# Patient Record
Sex: Male | Born: 1985 | Race: White | Hispanic: No | Marital: Single | Smoking: Never smoker
Health system: Southern US, Community
[De-identification: ages and names within clinical notes are randomized; demographics above are authoritative.]

## PROBLEM LIST (undated history)

## (undated) DIAGNOSIS — G43909 Migraine, unspecified, not intractable, without status migrainosus: Secondary | ICD-10-CM

## (undated) HISTORY — DX: Migraine, unspecified, not intractable, without status migrainosus: G43.909

---

## 2010-04-10 IMAGING — CR DG ANKLE COMPLETE 3+V*R*
3 series · 3 of 3 positions shown · non-contrast
Comparison: None.

CLINICAL DATA: Twisted ankle.

RIGHT ANKLE - COMPLETE 3+ VIEW

[view not recorded (1 of 3)]
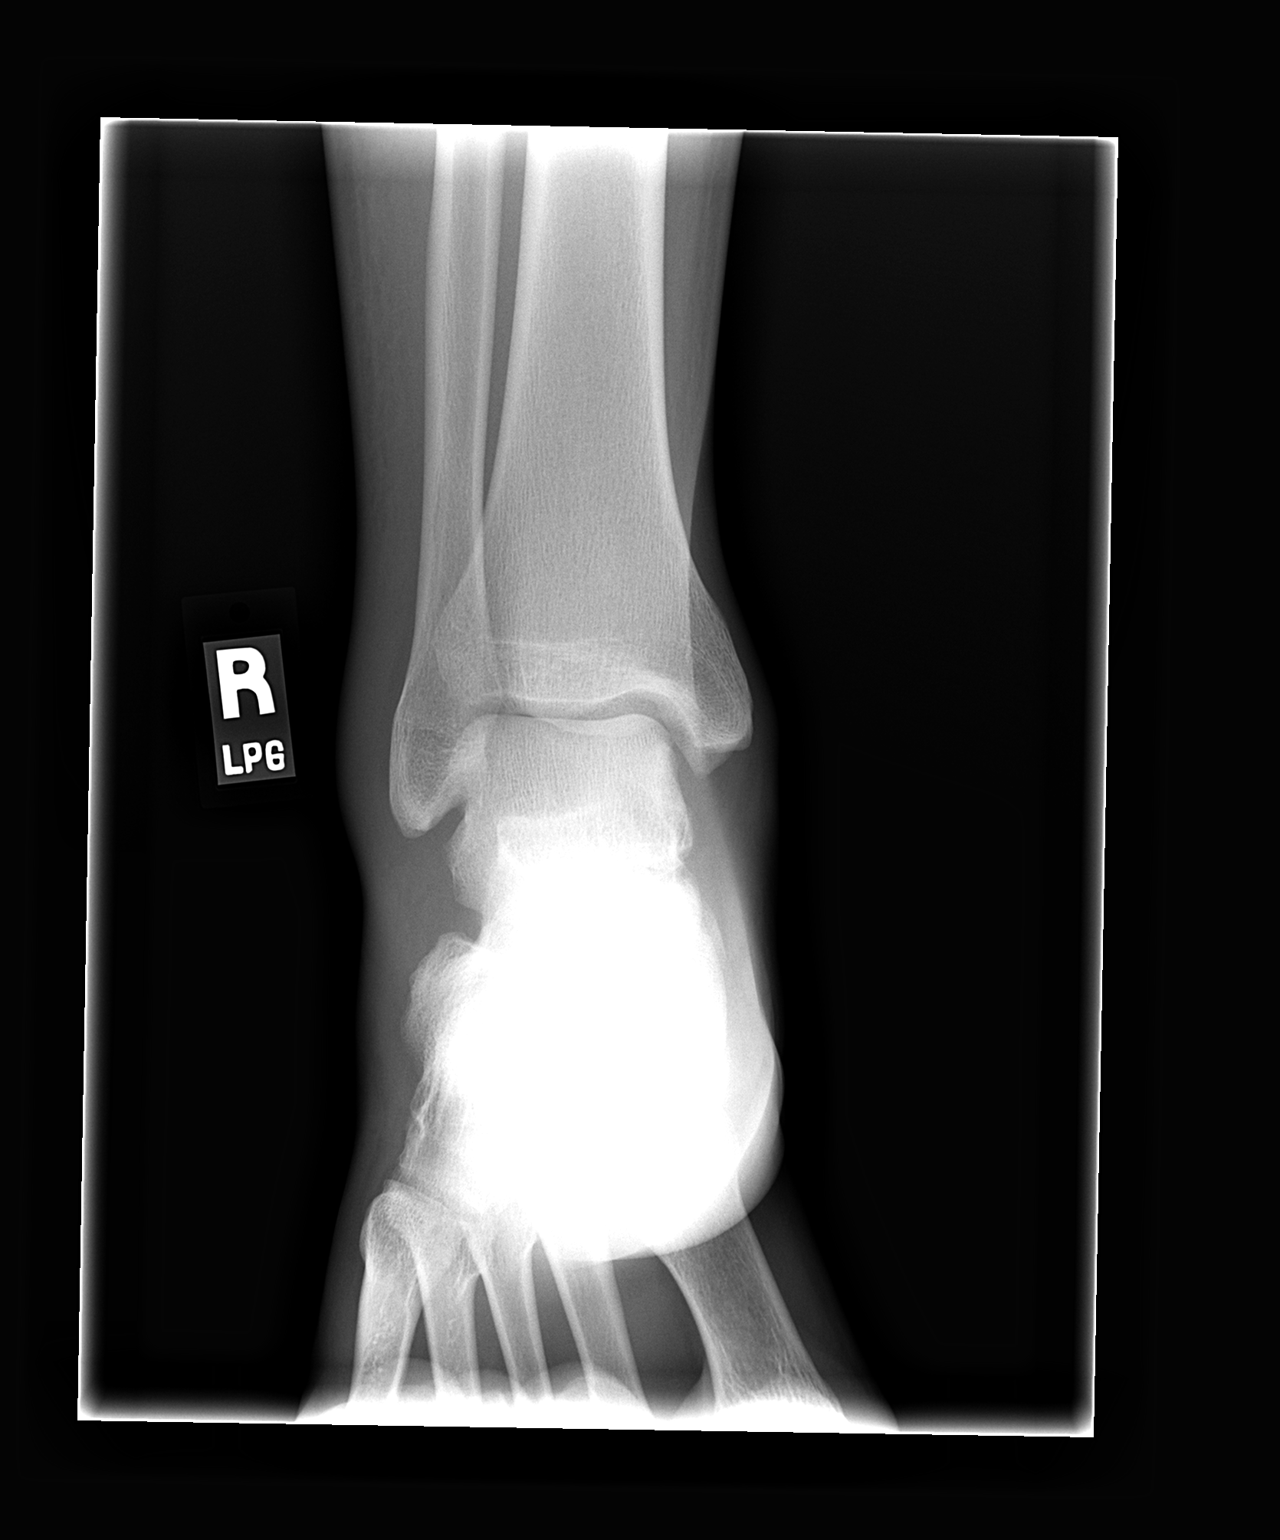

[view not recorded (2 of 3)]
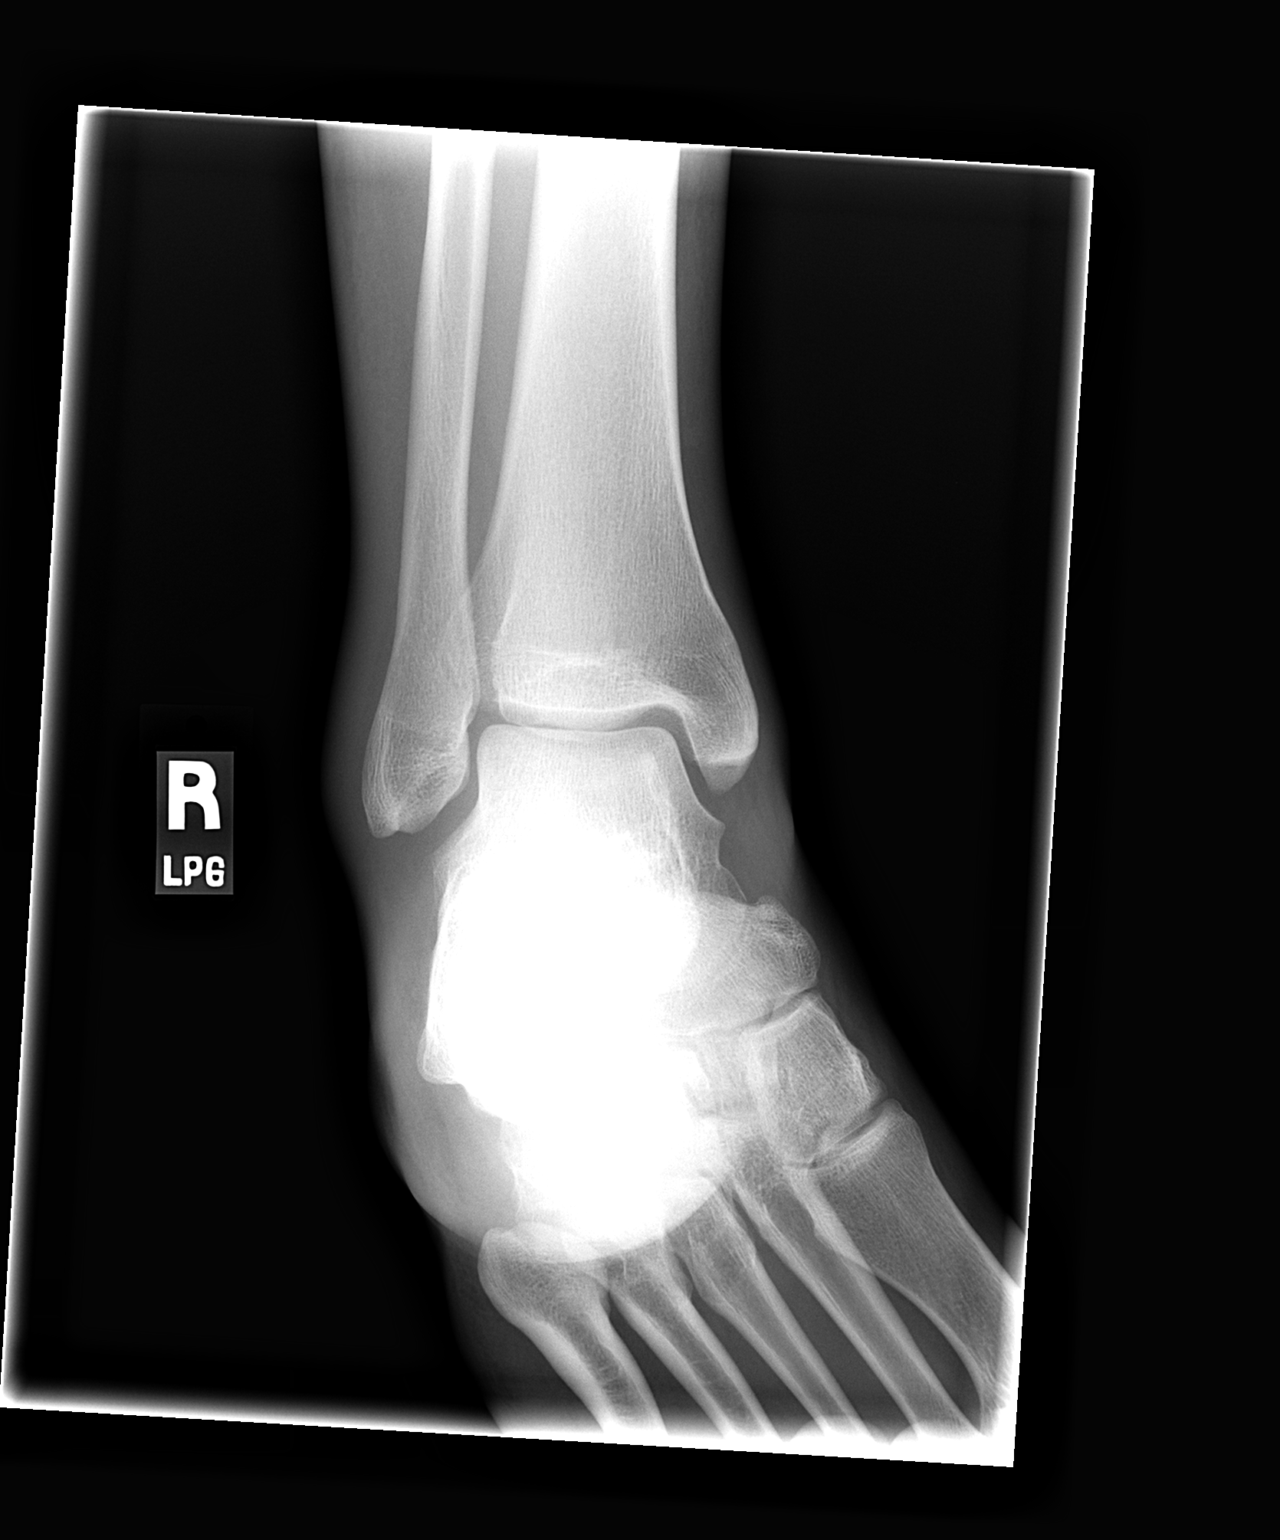

[view not recorded (3 of 3)]
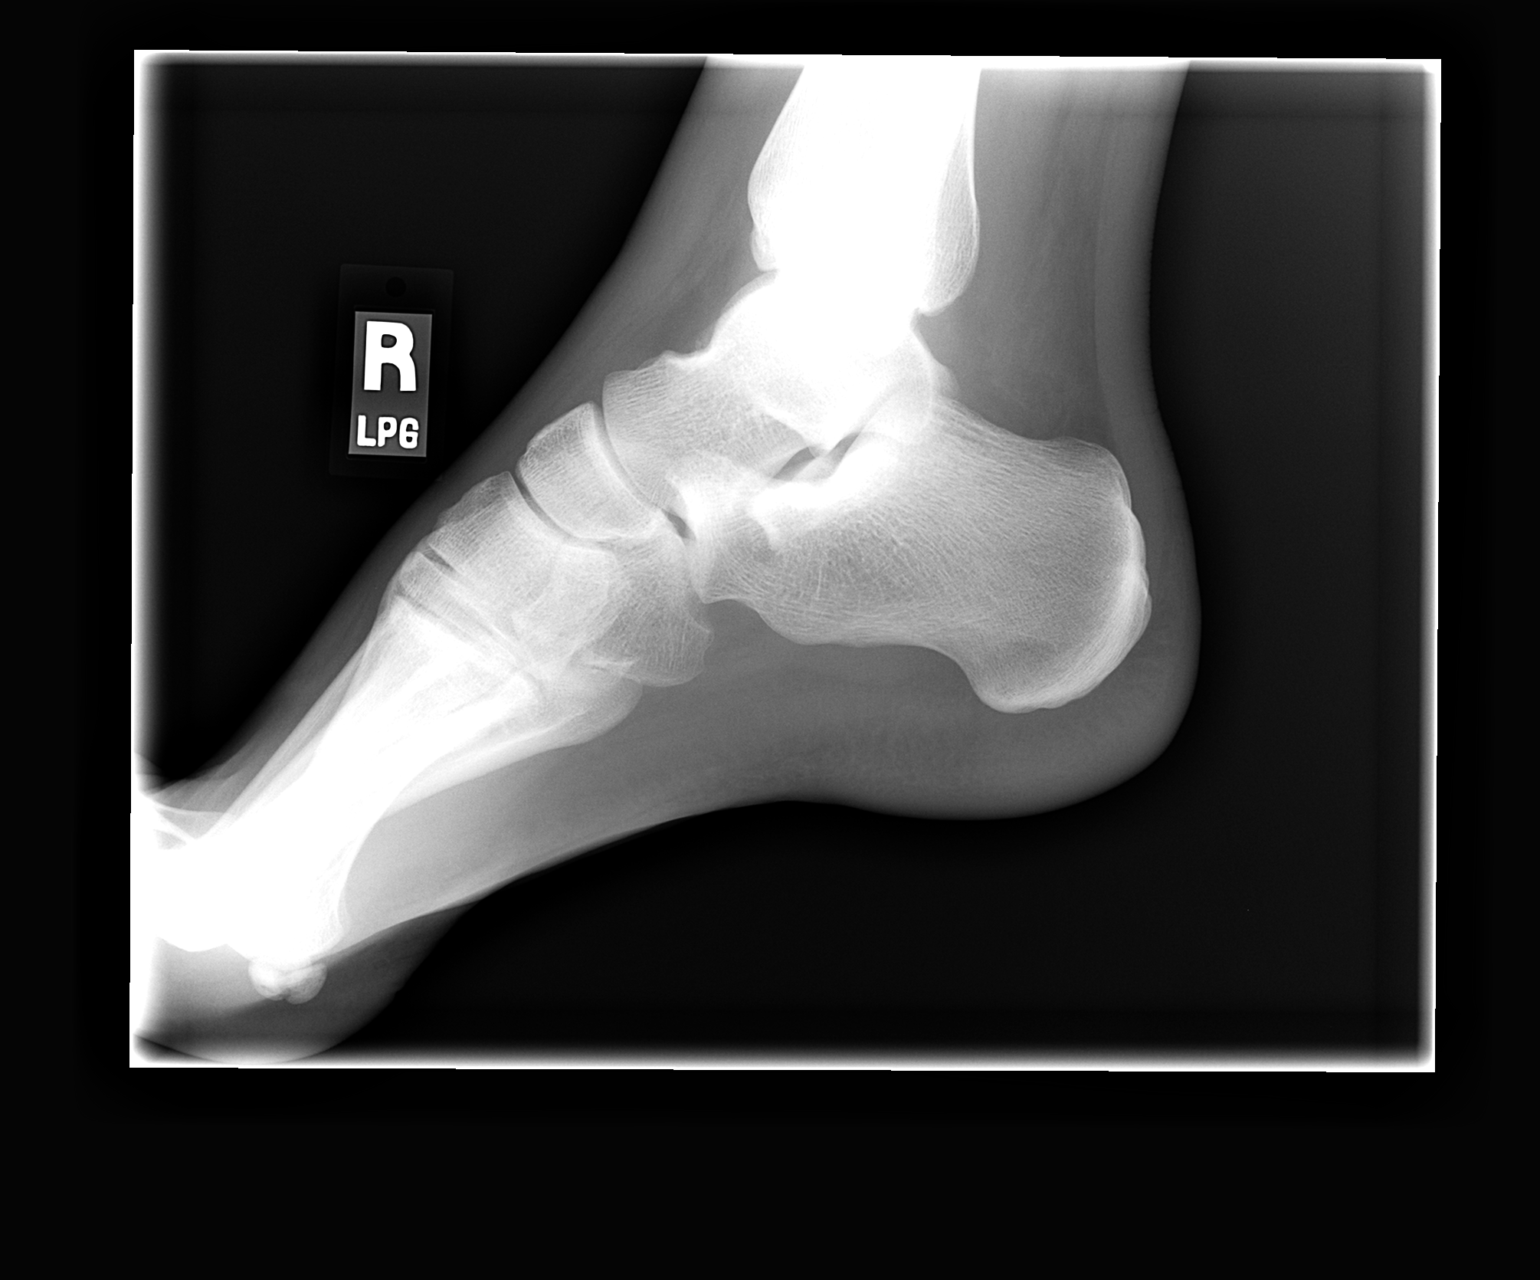

[3 of 3 positions shown; findings below may reference images not displayed]

FINDINGS: Negative for fracture.  There is lateral soft tissue
swelling.  There is a small joint effusion.
IMPRESSION: Negative for fracture.

## 2011-02-18 ENCOUNTER — Emergency Department
Admission: EM | Admit: 2011-02-18 | Disposition: A | Discharge status: Home / Self Care | Payer: Self-pay | Source: Ambulatory Visit

## 2017-12-16 ENCOUNTER — Emergency Department
Admission: EM | Admit: 2017-12-16 | Discharge: 2017-12-16 | Disposition: A | Discharge status: Home / Self Care | Payer: Self-pay | Attending: Nurse Practitioner | Admitting: Nurse Practitioner

## 2017-12-16 DIAGNOSIS — T671XXA Heat syncope, initial encounter: Secondary | ICD-10-CM | POA: Insufficient documentation

## 2017-12-16 DIAGNOSIS — E860 Dehydration: Secondary | ICD-10-CM | POA: Insufficient documentation

## 2017-12-16 LAB — CBC AND DIFFERENTIAL
Basophils %: 1 % (ref 0.0–3.0)
Basophils Absolute: 0.1 10*3/uL (ref 0.0–0.3)
Eosinophils %: 1.7 % (ref 0.0–7.0)
Eosinophils Absolute: 0.1 10*3/uL (ref 0.0–0.8)
Hematocrit: 40.2 % (ref 39.0–52.5)
Hemoglobin: 14 gm/dL (ref 13.0–17.5)
Lymphocytes Absolute: 1.3 10*3/uL (ref 0.6–5.1)
Lymphocytes: 24.6 % (ref 15.0–46.0)
MCH: 32 pg (ref 28–35)
MCHC: 35 gm/dL (ref 31–36)
MCV: 91 fL (ref 80–100)
MPV: 7.2 fL (ref 6.0–10.0)
Monocytes Absolute: 0.3 10*3/uL (ref 0.1–1.7)
Monocytes: 4.9 % (ref 3.0–15.0)
Neutrophils %: 67.8 % (ref 42.0–78.0)
Neutrophils Absolute: 3.6 10*3/uL (ref 1.7–8.6)
PLT CT: 210 10*3/uL (ref 130–440)
RBC: 4.4 10*6/uL (ref 4.00–5.70)
RDW: 10.6 % (ref 10.5–14.5)
WBC: 5.3 10*3/uL (ref 4.0–11.0)

## 2017-12-16 LAB — COMPREHENSIVE METABOLIC PANEL
ALT: 15 U/L (ref 0–55)
AST (SGOT): 23 U/L (ref 10–42)
Albumin/Globulin Ratio: 1.93 Ratio (ref 0.80–2.00)
Albumin: 4.4 gm/dL (ref 3.5–5.0)
Alkaline Phosphatase: 71 U/L (ref 40–145)
Anion Gap: 14 mMol/L (ref 7.0–18.0)
BUN / Creatinine Ratio: 23.8 Ratio (ref 10.0–30.0)
BUN: 20 mg/dL (ref 7–22)
Bilirubin, Total: 0.6 mg/dL (ref 0.1–1.2)
CO2: 25.7 mMol/L (ref 20.0–30.0)
Calcium: 8.7 mg/dL (ref 8.5–10.5)
Chloride: 105 mMol/L (ref 98–110)
Creatinine: 0.84 mg/dL (ref 0.80–1.30)
EGFR: 118 mL/min/{1.73_m2} (ref 60–150)
Globulin: 2.3 gm/dL (ref 2.0–4.0)
Glucose: 113 mg/dL — ABNORMAL HIGH (ref 71–99)
Osmolality Calc: 283 mOsm/kg (ref 275–300)
Potassium: 4.1 mMol/L (ref 3.5–5.3)
Protein, Total: 6.6 gm/dL (ref 6.0–8.3)
Sodium: 140 mMol/L (ref 136–147)

## 2017-12-16 LAB — VH URINALYSIS WITH MICROSCOPIC AND CULTURE IF INDICATED
Bilirubin, UA: NEGATIVE mg/dL
Blood, UA: NEGATIVE mg/dL
Glucose, UA: NEGATIVE mg/dL
Leukocyte Esterase, UA: NEGATIVE Leu/uL
Nitrite, UA: NEGATIVE
Protein, UR: NEGATIVE mg/dL
RBC, UA: NONE SEEN /hpf
Urine Specific Gravity: 1.02 (ref 1.001–1.040)
Urobilinogen, UA: 0.2 mg/dL
WBC, UA: NONE SEEN /hpf
pH, Urine: 6.5 pH (ref 5.0–8.0)

## 2017-12-16 MED ORDER — SODIUM CHLORIDE 0.9 % IV BOLUS
1000.0000 mL | Freq: Once | INTRAVENOUS | Status: AC
Start: 2017-12-16 — End: 2017-12-16
  Administered 2017-12-16: 1000 mL via INTRAVENOUS

## 2017-12-16 NOTE — Discharge Instructions (Signed)
Sure to increase her fluid intake in times of high heat and outdoor work to minimize dehydration risks.    Understanding Heat Stress  In hot environments, your body may have trouble keeping its temperature at a safe level. As a result, your body temperature rises to unsafe levels. This is heat stress. Heat stress can develop quickly. It can also be very dangerous to your health.  How your body handles heat  To function, your body requires your core temperaturestay very close to normal. Your body has a normal core temperature of 98.44F (37C). If the environment is hot or you are very active, your body heats up. To keep your core temperature stable, your body releases excess heat into the air. The heat leaves your body from the blood vessels near the skin's surface and through sweat.  Blood flow cools your body  When your body needs to release heat, the blood vessels near the surface of your skin widen. Extra blood flows through them. This extra blood brings more body heat to the surface, which releases it into the air. Your body needs enough water and minerals, such as sodium, to keep blood flowing smoothly to these vessels and to the rest of the body.  Sweat carries away heat  If increased blood flow alone isn't enough, your body also increases the amount that you sweat. As sweat dries (evaporates), it cools the skin. As you sweat, your body loses water (and some minerals such as sodium and potassium). This water (and minerals) must be replaced to keep you feeling well and healthy, and to allow further sweating.  Conditions that contribute to heat stress   Too much activity. The more active you are, the more heat your muscles generate. Heavy physical activity also sets up competition between your muscles and skin for the blood supply.   Poor acclimatization. If you are not used to physical activity or not used to hot temperatures, you are more likely to have effects from the heat.   High environmental  temperature. As the temperature in your environment goes up, so does your body temperature. When it's hot from the sun or another heat source, such as a furnace, your body can't move heat into the air as effectively.   Too little air movement. Air moving across your skin carries away heat brought to the surface by blood vessels. It also helps sweat evaporate. Too little air movement means these processes don't work as well.   High humidity. Humidity is the amount of moisture in the air. The higher the humidity, the less sweat evaporates. That's because the air is too wet to absorb more moisture.   Medical problems. Some medical conditions, including diabetes and heart failure, can increase your susceptibility to heat stress.   Medicine. If you take medicine for heart rate, asthma, kidney disease, or to manage fluid retention, you may be more sensitive to heat.  Date Last Reviewed: 11/24/2016   2000-2018 [employer] [place]. [address] [place], [place] [address]. All rights reserved. This information is not intended as a substitute for professional medical care. Always follow your healthcare professional's instructions.

## 2017-12-16 NOTE — ED Provider Notes (Signed)
[facility] EMERGENCY DEPARTMENT   History and Physical Exam      Patient Name: Jared Marsh, Jared Marsh  Encounter Date:  12/16/2017  Attending Physician: Gareth Morgan,*  Nurse Practitioner: Jefm Petty, NP  PCP: Marisa Sprinkles, MD  Patient DOB:  01/28/1986  MRN:  [id number]  Room:  E6/ED6-A      History of Presenting Illness     Chief complaint: Syncope    HPI/ROS is limited by: none  HPI/ROS given by: patient      Provocative: dehydration  Pallative Factors: Fluid intake  Quality: Syncope  Region: cerebral  Radiation: none  Severity: moderate  Duration/Temporal Factors: 2 hours PTA      Jared Marsh is a 32 y.o. male who presents with Complaints of syncopal episode after having worked outside all day today in [place]. Patient states he's been working outside since approximately 0700 this morning until 5 PM and son. He is noticed to have a bright red sunburn area directly below his hairline on his forehead.   He states that he had take minimal by mouth intake throughout the daytime today " probably less than 1/2 gallon".  States once he was inside stood up and began to feel lightheaded and is reported to have fallen striking the back of his head on the floor.  He believes he was only out for a few seconds and when he sat up and became lightheaded and passed out again. No contusions or abrasions are noted on the scalp.   By-standers state that he was profusely diaphoretic surrounding the event.  He admits to a frontal headache which she says is typical of his usual migraine presentation.  He also states he hasn't had "spots" in his right visual field but there are no longer present.  This is also typical of his migraine presentation.  Current headache is stated to be 3 out of 10.  He denies previous fever, chills, no current nausea or vomiting.  He has not attempted any treatment prior to arrival.       Review of Systems   Review of Systems   Constitutional: Negative for chills, fever and weight loss.   HENT: Negative for  congestion, ear pain, sinus pain and sore throat.    Eyes:        Previous "Spots" in the right vision   Respiratory: Negative for cough, sputum production, shortness of breath and wheezing.    Cardiovascular: Negative for chest pain and leg swelling.   Gastrointestinal: Negative for abdominal pain, diarrhea, nausea and vomiting.   Genitourinary: Negative for dysuria.   Musculoskeletal: Positive for falls.   Skin:        Forehead sunburn   Neurological: Positive for dizziness, loss of consciousness and headaches.   Endo/Heme/Allergies: Negative.    Psychiatric/Behavioral: Negative.        Allergies     Pt has No Known Allergies.    Medications     No current outpatient prescriptions on file.       Past Medical History     Pt has a past medical history of Migraines.    Past Surgical History     Pt has no past surgical history on file.    Family History     The family history is not on file.    Social History     Pt reports that he has never smoked. His smokeless tobacco use includes Chew. He reports that he does not drink alcohol or use drugs.  Physical Exam     Vitals:    12/16/17 1919 12/16/17 1951 12/16/17 2020   BP: 145/88 127/83 140/80   Pulse: 75 67 68   Resp: 18 16 13    Temp: 98.2 F (36.8 C)     TempSrc: Oral     SpO2: 99% 98% 97%       Physical Exam   Constitutional: He is oriented to person, place, and time. He appears well-developed and well-nourished.   HENT:   Head: Normocephalic and atraumatic.   Right Ear: Hearing and tympanic membrane normal.   Left Ear: Hearing and tympanic membrane normal.   Nose: Nose normal.   Mouth/Throat: Uvula is midline. Mucous membranes are dry.   Eyes: Pupils are equal, round, and reactive to light.   Neck: Normal range of motion. Neck supple. No thyromegaly present.   Cardiovascular: Normal rate, normal heart sounds and intact distal pulses.    Pulmonary/Chest: Effort normal and breath sounds normal. He has no wheezes.   Abdominal: Soft. Bowel sounds are normal. He  exhibits no distension.   Musculoskeletal: Normal range of motion.   Neurological: He is alert and oriented to person, place, and time. He has normal strength and normal reflexes. A sensory deficit is present. No cranial nerve deficit. He displays a negative Romberg sign. GCS eye subscore is 4. GCS verbal subscore is 5. GCS motor subscore is 6.   Skin: Skin is warm and dry.   Psychiatric: He has a normal mood and affect.   Nursing note and vitals reviewed.      Orders Placed     Orders Placed This Encounter   Procedures   . CBC   . CMP   . Urinalysis w Microscopic and Culture if Indicated   . ECG 12 lead (Stat)       Diagnostic Results       The results of the diagnostic studies below have been reviewed by myself:    Labs  Results     Procedure Component Value Units Date/Time    Urinalysis w Microscopic and Culture if Indicated [[id number]]  (Abnormal) Collected:  12/16/17 2018    Specimen:  Urine, Random Updated:  12/16/17 2120     Color, UA Yellow     Clarity, UA Clear     Specific Gravity, UR 1.020     pH, Urine 6.5 pH      Protein, UR Negative mg/dL      Glucose, UA Negative mg/dL      Ketones UA Trace (A) mg/dL      Bilirubin, UA Negative mg/dL      Blood, UA Negative mg/dL      Nitrite, UA Negative     Urobilinogen, UA 0.2 mg/dL      Leukocyte Esterase, UA Negative Leu/uL      UR Micro Performed     WBC, UA None Seen /hpf      RBC, UA None Seen /hpf      Bacteria, UA None /hpf      Squam Epithel, UA 1-5 /lpf     CMP [[id number]]  (Abnormal) Collected:  12/16/17 1941    Specimen:  Plasma Updated:  12/16/17 2039     Sodium 140 mMol/L      Potassium 4.1 mMol/L      Chloride 105 mMol/L      CO2 25.7 mMol/L      Calcium 8.7 mg/dL      Glucose 782 (H) mg/dL  Creatinine 0.84 mg/dL      BUN 20 mg/dL      Protein, Total 6.6 gm/dL      Albumin 4.4 gm/dL      Alkaline Phosphatase 71 U/L      ALT 15 U/L      AST (SGOT) 23 U/L      Bilirubin, Total 0.6 mg/dL      Albumin/Globulin Ratio 1.93 Ratio      Anion Gap 14.0  mMol/L      BUN/Creatinine Ratio 23.8 Ratio      EGFR 118 mL/min/1.55m2      Osmolality Calc 283 mOsm/kg      Globulin 2.3 gm/dL     CBC [[id number]] Collected:  12/16/17 1941    Specimen:  Blood from Blood Updated:  12/16/17 2024     WBC 5.3 K/cmm      RBC 4.40 M/cmm      Hemoglobin 14.0 gm/dL      Hematocrit 56.2 %      MCV 91 fL      MCH 32 pg      MCHC 35 gm/dL      RDW 13.0 %      PLT CT 210 K/cmm      MPV 7.2 fL      NEUTROPHIL % 67.8 %      Lymphocytes 24.6 %      Monocytes 4.9 %      Eosinophils % 1.7 %      Basophils % 1.0 %      Neutrophils Absolute 3.6 K/cmm      Lymphocytes Absolute 1.3 K/cmm      Monocytes Absolute 0.3 K/cmm      Eosinophils Absolute 0.1 K/cmm      BASO Absolute 0.1 K/cmm           Radiologic Studies  Radiology Results (24 Hour)     ** No results found for the last 24 hours. **          EKG:   Last EKG Result     None            Procedures       ED Course & MDM / Critical Care     Blood pressure 140/80, pulse 68, temperature 98.2 F (36.8 C), temperature source Oral, resp. rate 13, SpO2 97 %.    I reviewed the vital signs, nursing notes, past medical history, past surgical history, family history and social history.   I have reviewed the patient's previous charts.         ED Course as of Dec 16 2212   Thu Dec 16, 2017   2015 Discussed current workup with patient and need to evaluate CT of the head.  He states he would like to decline the CAT scan as he "does not have insurance ".  I explained the rationale for the next syncopal complete syncope workup but patient states he would still like to decline CAT scan.  He is currently alert and oriented.    [LP]   2043 Patient states he feels much better following hydration with the initial liter fluid. We will continue with oral hydration.   [LP]      ED Course User Index  [LP] Rennis Petty, NP         This patient presents to the Emergency Department with a syncopal episode and dehydration.  Based on my history, examination, and  evaluation, several differential diagnoses including vasovagal syncope,  orthostatic hypotension, seizure, cardiac/neurologic causes for syncope have been considered.  Serious or potentially life-threatening causes of the patient's symptoms like cardiac/neurologic causes seem unlikely. The patient seems improved, is neurologically intact, has not sustained any significant injury from the syncope, and can be discharged home and managed with symptomatic care.  I advised the patient to return to the emergency department immediately should they develop recurrent syncope, neurologic symptoms, chest pain, or any acute concerns .  Diagnostic impression and plan were discussed with the patient and family.  If ordered, results of lab tests were reviewed and discussed with the patient and/or family. Questions were answered and concerns were addressed.  The patient was encouraged to follow-up with their primary care provider or specialist.      Patient verbalized understanding and was agreeable to plan.     I discussed this case with the attending physician in the emergency department and they agree with the assessment and treatment plan.     Medications Given in ED:  E6-ED6-A - MAR ACTION REPORT  (last 24 hrs)         EVERETT, MANDY N, RN       Medication Name Action Time Action Site Route Rate Dose Reason Comments User     sodium chloride 0.9 % bolus 1,000 mL 12/16/17 2015 New Bag  Intravenous 1,000 mL/hr 1,000 mL   Sharlyne Cai, RN     sodium chloride 0.9 % bolus 1,000 mL 12/16/17 2046 Stopped  Intravenous     Sharlyne Cai, RN                Prescriptions:  New Prescriptions    No medications on file         Follow-up Information     Tichenor, Sherryll Burger, NP.    Specialty:  Nurse Practitioner  Why:  As needed for primary care  Contact information:  [address] [place]  [facility]  [place] [address]  [phone]             [facility] Emergency Department.    Specialty:  Emergency Medicine  Why:  If symptoms  worsen  Contact information:  [address]  [place] [address]  [phone]                       Diagnosis / Disposition     Clinical Impression  1. Heat syncope, initial encounter    2. Dehydration        Disposition  ED Disposition     ED Disposition Condition Date/Time Comment    Discharge  Thu Dec 16, 2017 10:10 PM Rande Lawman discharge to home/self care.    Condition at disposition: Stable              This chart was generated by an EMR and may contain errors, including typographical, or omissions not intended by the user.          Rennis Petty, NP  12/16/17 2214       Gareth Morgan, MD  12/17/17 [phone]

## 2017-12-16 NOTE — ED Notes (Signed)
Discharge education provided, patient expressed understanding, all questions/concerns addressed.  Patient discharged with belongings via ambulation.

## 2017-12-17 LAB — ECG 12-LEAD
P Wave Axis: 80 deg
P-R Interval: 166 ms
Patient Age: 31 years
Q-T Interval(Corrected): 403 ms
Q-T Interval: 400 ms
QRS Axis: 80 deg
QRS Duration: 93 ms
T Axis: 51 years
Ventricular Rate: 61 //min

## 2022-01-24 ENCOUNTER — Encounter (INDEPENDENT_AMBULATORY_CARE_PROVIDER_SITE_OTHER): Payer: Self-pay

## 2022-01-24 ENCOUNTER — Ambulatory Visit (INDEPENDENT_AMBULATORY_CARE_PROVIDER_SITE_OTHER): Payer: 59 | Admitting: Family

## 2022-01-24 VITALS — BP 122/68 | HR 83 | Temp 98.7°F | Resp 18 | Ht 76.0 in | Wt 170.0 lb

## 2022-01-24 DIAGNOSIS — R051 Acute cough: Secondary | ICD-10-CM

## 2022-01-24 MED ORDER — AMOXICILLIN-POT CLAVULANATE 875-125 MG PO TABS
1.0000 | ORAL_TABLET | Freq: Two times a day (BID) | ORAL | 0 refills | Status: AC
Start: 2022-01-24 — End: 2022-01-31

## 2022-01-24 MED ORDER — ALBUTEROL SULFATE HFA 108 (90 BASE) MCG/ACT IN AERS
2.0000 | INHALATION_SPRAY | RESPIRATORY_TRACT | 0 refills | Status: AC | PRN
Start: 2022-01-24 — End: 2023-01-24

## 2022-01-24 NOTE — Progress Notes (Signed)
Subjective:    Patient ID: Jared Marsh is a 36 y.o. male.    HPI  Patient presents to clinic with a chief complaint of nasal congestion and runny nose that began over a week ago.  Reports that in the last 3 days he has had increased chest congestion.  Reports that he is unable to take a deep breath in due to discomfort and right side of chest.  Reports that when he coughs also experience the pain.  Reports that he has had increased shortness of breath on exertion.  Reports that he has had increased coughing with sputum production.  Denies any fever, fatigue, lightheadedness, dizziness or nausea/vomiting.  Any in-home illnesses.  Denies any known COVID/flu/strep exposure.  Denies taking anything over-the-counter for symptom relief.  The following portions of the patient's history were reviewed and updated as appropriate: allergies, current medications, past family history, past medical history, past social history, past surgical history, and problem list.    Review of Systems   Constitutional:  Negative for fatigue and fever.   HENT:  Positive for congestion and rhinorrhea. Negative for sore throat.    Respiratory:  Positive for cough, chest tightness and shortness of breath. Negative for wheezing.    Cardiovascular:  Negative for chest pain.   Gastrointestinal:  Negative for abdominal pain, diarrhea, nausea and vomiting.   Musculoskeletal:  Negative for arthralgias and myalgias.   Skin:  Negative for rash.   Neurological:  Negative for dizziness and headaches.         Objective:    BP 122/68   Pulse 83   Temp 98.7 F (37.1 C) (Tympanic)   Resp 18   Ht 1.93 m (6\' 4" )   Wt 77.1 kg (170 lb)   SpO2 95%   BMI 20.69 kg/m     Physical Exam  Vitals reviewed.   Constitutional:       General: He is not in acute distress.     Appearance: He is well-developed. He is not ill-appearing or toxic-appearing.   HENT:      Head: Normocephalic.      Right Ear: External ear normal.      Left Ear: External ear normal.       Nose: No congestion or rhinorrhea.   Eyes:      General:         Right eye: No discharge.         Left eye: No discharge.      Conjunctiva/sclera: Conjunctivae normal.   Cardiovascular:      Rate and Rhythm: Normal rate and regular rhythm.      Heart sounds: Normal heart sounds.   Pulmonary:      Effort: Pulmonary effort is normal. No respiratory distress.      Breath sounds: Rhonchi present. No wheezing.   Musculoskeletal:      Cervical back: Normal range of motion.   Lymphadenopathy:      Cervical: No cervical adenopathy.   Skin:     General: Skin is warm and dry.   Neurological:      Mental Status: He is alert and oriented to person, place, and time.           Assessment and Plan:       Amrom was seen today for chest congestion.    Diagnoses and all orders for this visit:    Acute cough  -     amoxicillin-clavulanate (AUGMENTIN) 875-125 MG per tablet; Take 1 tablet by mouth 2 (  two) times daily for 7 days  -     albuterol sulfate HFA (PROVENTIL) 108 (90 Base) MCG/ACT inhaler; Inhale 2 puffs into the lungs every 4 (four) hours as needed for Wheezing      -Chest x-ray is unavailable in clinic.  Patient declined going to an urgent care clinic for x-ray.  -Albuterol every 4-6 hours as needed for cough, shortness of breath, or wheezing.  -Delsym or Mucinex PRN cough.  -Augmentin prescribed  -Declined COVID/flu screening.  -Patient care or emergency department if not improved or start with fever for further evaluation and possibly a chest xray.  -May take Tylenol or Ibuprofen as directed by the instructions on the packaging as needed pain or fever.  -Drink lots of water, monitor PO intake and urine output for hydration.  -Flonase will help with inflammation of the nose and post nasal drip.  -Use saline spray in the nose, or a humidifier to loosen discharge.  -Zyrtec or Claritin for allergy symptoms.  -Please noted.  SPO2 95% on room air.  -Follow-up in clinic or emergency department if any concerning symptoms arise  or if symptoms persist/worsen.           Darra LisNatasha M Harris-Carter, FNP  [facility]  01/24/2022  12:21 PM

## 2022-01-28 ENCOUNTER — Telehealth (INDEPENDENT_AMBULATORY_CARE_PROVIDER_SITE_OTHER): Payer: Self-pay

## 2022-01-28 NOTE — Telephone Encounter (Signed)
Called to check on patient after recent visit. Left a message to call back if any questions or concerns.
